# Patient Record
Sex: Male | Born: 1935
Health system: Midwestern US, Community
[De-identification: ages and names within clinical notes are randomized; demographics above are authoritative.]

---

## 2014-09-05 IMAGING — CR XR CHEST 2 VIEWS
3 series · 3 of 3 positions shown · non-contrast
Comparison: none

THREE VIEW CHEST X-RAY
INDICATION: Cough.

[PA (1 of 2)]
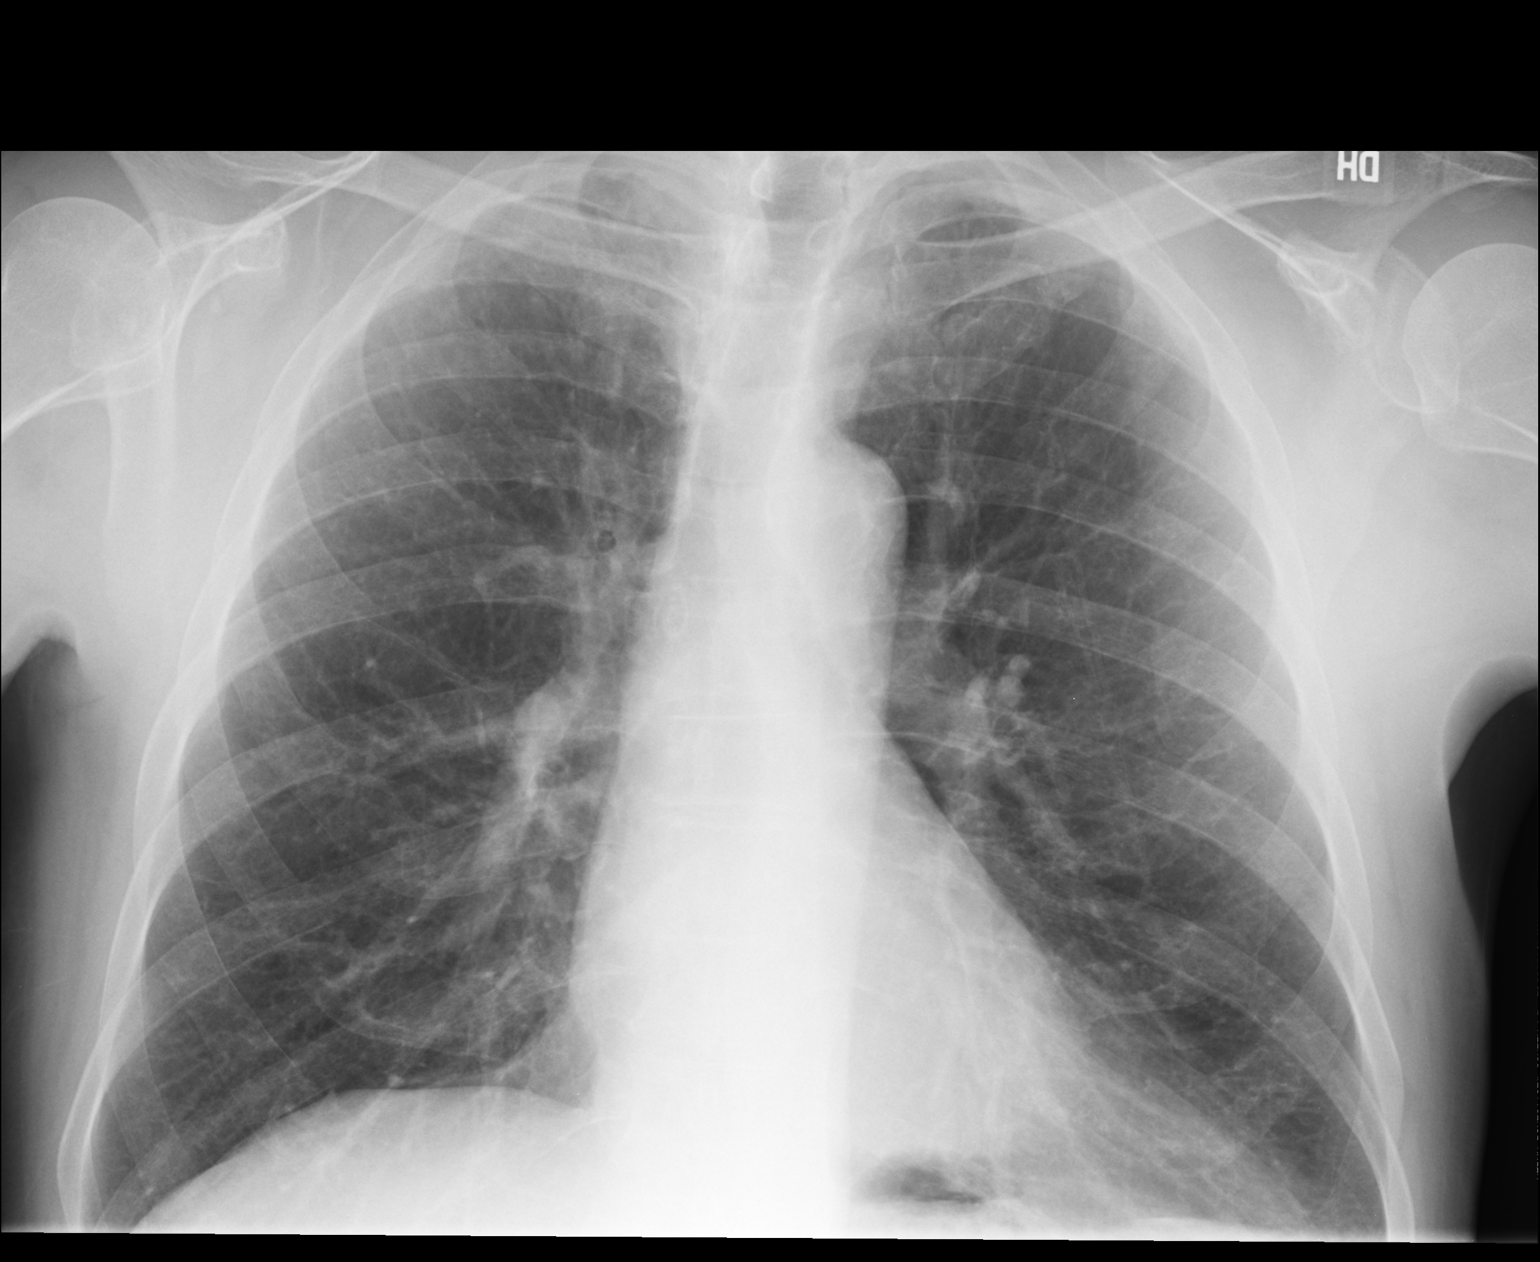

[PA (2 of 2)]
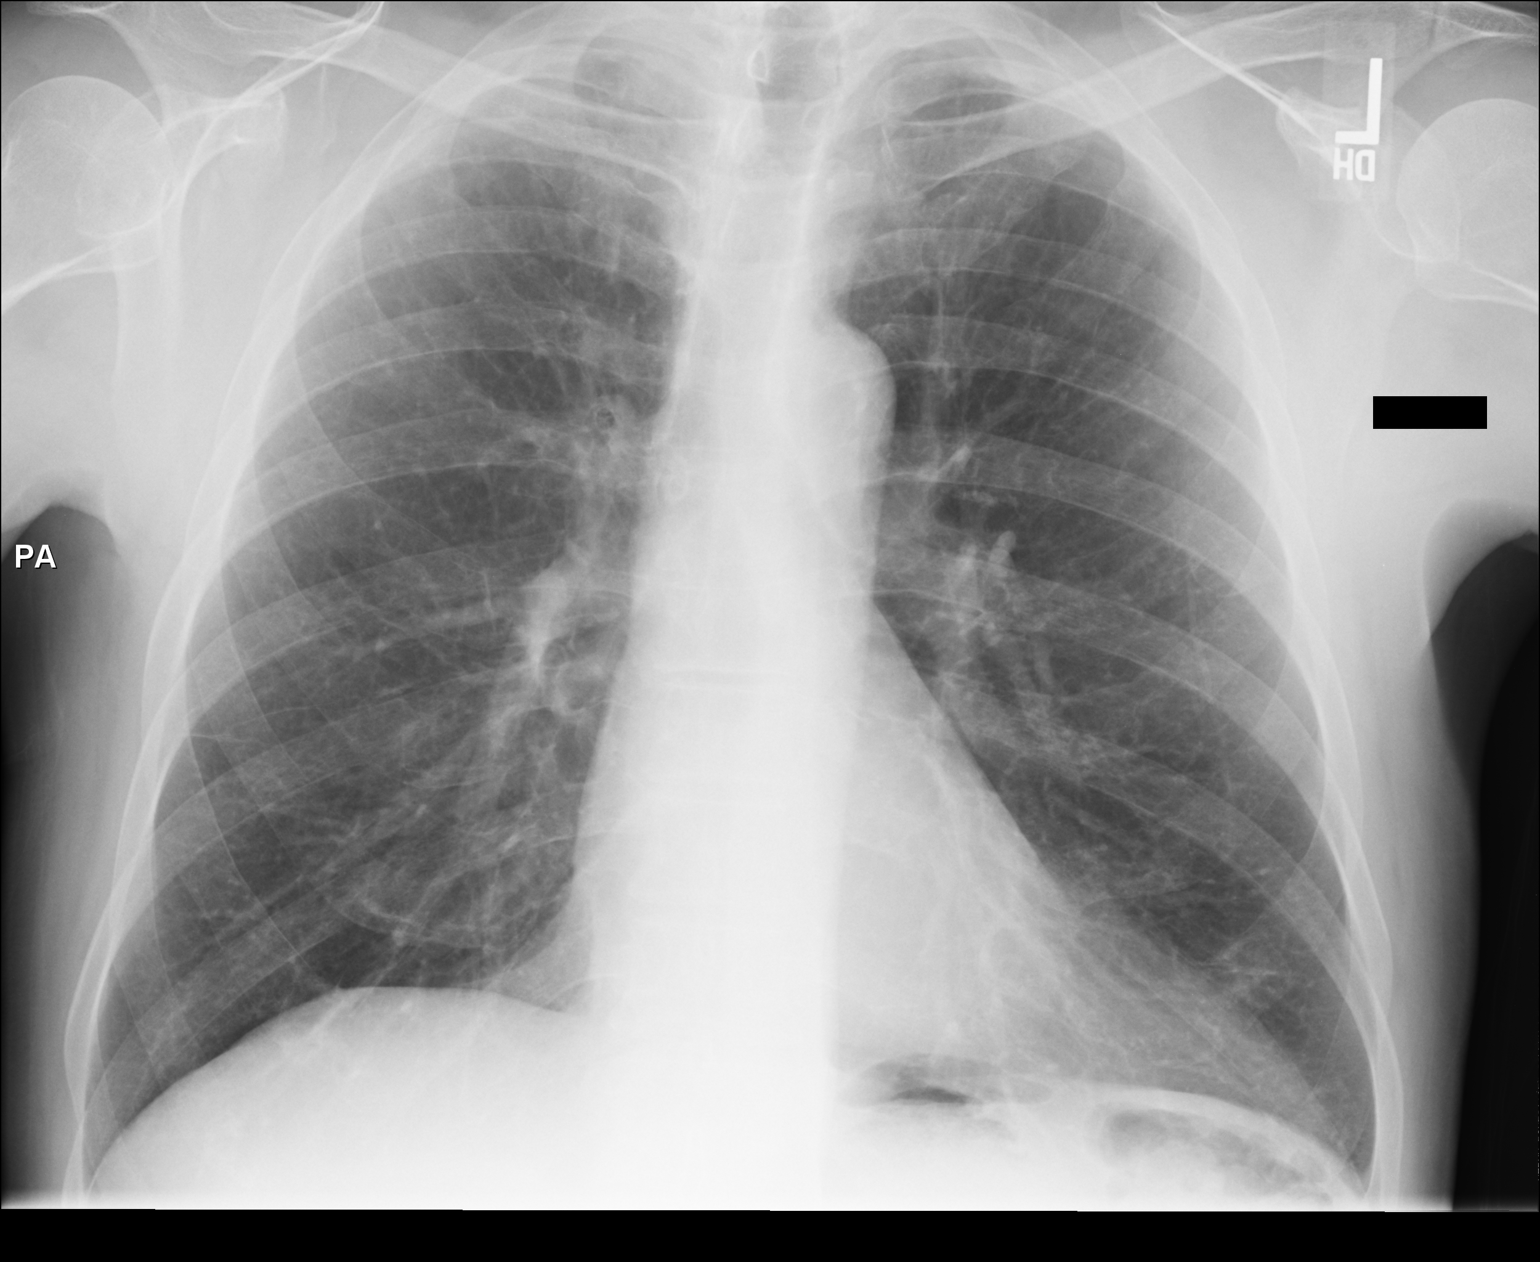

[lateral]
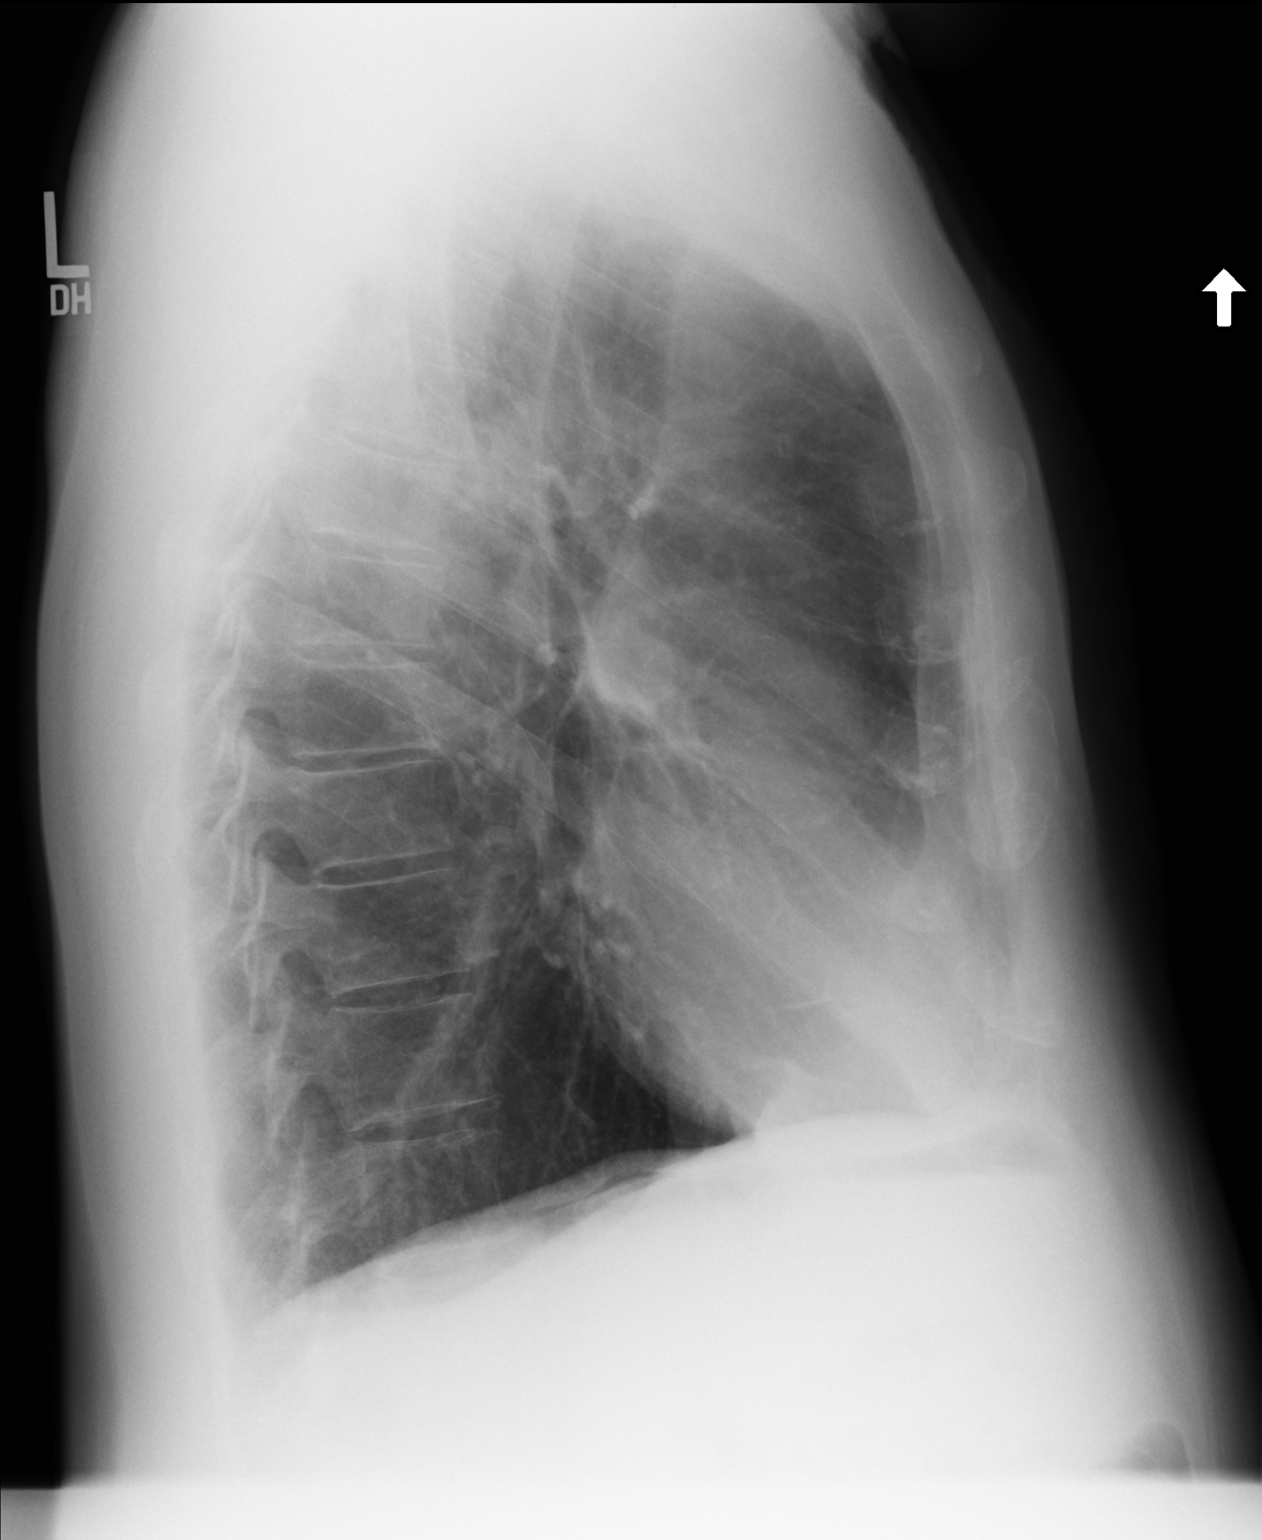

[3 of 3 positions shown; findings below may reference images not displayed]

FINDINGS: The heart and vascularity are unremarkable.  Surgical orthopedic hardware is present.  No pneumothorax is visualized.  Apical scarring is present.  Scattered chronic appearing interstitial changes are present.  No focal consolidation is identified.

IMPRESSIONS:  Chronic lung changes.  No acute consolidation.

## 2021-10-15 NOTE — Telephone Encounter (Signed)
Location of patient: [place]    Received call from [facility] with [facility].    Jake Samples MRN: [id number]    Current Symptoms: Home care nurse Lanora Manis is calling. She states that the pt has not had a BM in 4 days. He has Lactulose ordered, which he has not yet tried. Lanora Manis gave him a dose of Lactulose. She is asking for an order for an enema in case the Lactulose does not work today.     On call provider, Dr. Virgel Manifold notified. He is in agreement with an enema.     Associated Symptoms: NA    Recommended disposition: Duplicate call. No triage.     Care advice provided, patient verbalizes understanding; denies any other questions or concerns.    This triage is a result of a call to the [facility] Nurse Line     Reason for Disposition   Caller has already spoken with the PCP and has no further questions.    Protocols used: No Contact or Duplicate Contact Call-ADULT-AH

## 2022-06-26 DEATH — deceased
